# Patient Record
Sex: Female | Born: 1960 | Race: Black or African American | Hispanic: No | Marital: Single | Smoking: Never smoker
Health system: Southern US, Community
[De-identification: ages and names within clinical notes are randomized; demographics above are authoritative.]

## PROBLEM LIST (undated history)

## (undated) DIAGNOSIS — I10 Essential (primary) hypertension: Secondary | ICD-10-CM

## (undated) DIAGNOSIS — T464X5A Adverse effect of angiotensin-converting-enzyme inhibitors, initial encounter: Secondary | ICD-10-CM

## (undated) DIAGNOSIS — T7840XA Allergy, unspecified, initial encounter: Secondary | ICD-10-CM

## (undated) DIAGNOSIS — R05 Cough: Secondary | ICD-10-CM

## (undated) DIAGNOSIS — J45909 Unspecified asthma, uncomplicated: Secondary | ICD-10-CM

## (undated) DIAGNOSIS — R058 Other specified cough: Secondary | ICD-10-CM

## (undated) HISTORY — DX: Adverse effect of angiotensin-converting-enzyme inhibitors, initial encounter: T46.4X5A

## (undated) HISTORY — DX: Cough: R05

## (undated) HISTORY — DX: Allergy, unspecified, initial encounter: T78.40XA

## (undated) HISTORY — DX: Essential (primary) hypertension: I10

## (undated) HISTORY — DX: Unspecified asthma, uncomplicated: J45.909

## (undated) HISTORY — DX: Other specified cough: R05.8

---
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
## 1990-12-01 HISTORY — PX: TUBAL LIGATION: SHX77

## 2004-01-09 ENCOUNTER — Other Ambulatory Visit: Admission: RE | Admit: 2004-01-09 | Discharge: 2004-01-09 | Payer: Self-pay | Admitting: Obstetrics and Gynecology

## 2007-09-24 ENCOUNTER — Ambulatory Visit (HOSPITAL_COMMUNITY): Admission: RE | Admit: 2007-09-24 | Discharge: 2007-09-24 | Payer: Self-pay | Admitting: Obstetrics and Gynecology

## 2009-05-22 ENCOUNTER — Ambulatory Visit (HOSPITAL_COMMUNITY): Admission: RE | Admit: 2009-05-22 | Discharge: 2009-05-22 | Payer: Self-pay | Admitting: Obstetrics and Gynecology

## 2010-07-22 ENCOUNTER — Ambulatory Visit (HOSPITAL_COMMUNITY): Admission: RE | Admit: 2010-07-22 | Discharge: 2010-07-22 | Payer: Self-pay | Admitting: Obstetrics and Gynecology

## 2011-10-17 ENCOUNTER — Other Ambulatory Visit (HOSPITAL_COMMUNITY): Payer: Self-pay | Admitting: Obstetrics and Gynecology

## 2011-10-17 DIAGNOSIS — Z1231 Encounter for screening mammogram for malignant neoplasm of breast: Secondary | ICD-10-CM

## 2011-11-19 ENCOUNTER — Ambulatory Visit (HOSPITAL_COMMUNITY)
Admission: RE | Admit: 2011-11-19 | Discharge: 2011-11-19 | Disposition: A | Discharge status: Home / Self Care | Payer: BC Managed Care – PPO | Source: Ambulatory Visit | Attending: Obstetrics and Gynecology | Admitting: Obstetrics and Gynecology

## 2011-11-19 DIAGNOSIS — Z1231 Encounter for screening mammogram for malignant neoplasm of breast: Secondary | ICD-10-CM | POA: Insufficient documentation

## 2011-11-28 ENCOUNTER — Ambulatory Visit (INDEPENDENT_AMBULATORY_CARE_PROVIDER_SITE_OTHER): Payer: BC Managed Care – PPO

## 2011-11-28 DIAGNOSIS — J45909 Unspecified asthma, uncomplicated: Secondary | ICD-10-CM

## 2012-11-25 ENCOUNTER — Ambulatory Visit (INDEPENDENT_AMBULATORY_CARE_PROVIDER_SITE_OTHER): Payer: BC Managed Care – PPO | Admitting: Family Medicine

## 2012-11-25 VITALS — BP 138/92 | HR 70 | Temp 98.1°F | Resp 16 | Ht 63.5 in | Wt 201.6 lb

## 2012-11-25 DIAGNOSIS — Z0000 Encounter for general adult medical examination without abnormal findings: Secondary | ICD-10-CM

## 2012-11-25 DIAGNOSIS — Z1211 Encounter for screening for malignant neoplasm of colon: Secondary | ICD-10-CM

## 2012-11-25 DIAGNOSIS — Z23 Encounter for immunization: Secondary | ICD-10-CM

## 2012-11-25 DIAGNOSIS — R195 Other fecal abnormalities: Secondary | ICD-10-CM

## 2012-11-25 LAB — POCT CBC
Granulocyte percent: 34.4 % — AB (ref 37–80)
HCT, POC: 47 % (ref 37.7–47.9)
Hemoglobin: 14.8 g/dL (ref 12.2–16.2)
Lymph, poc: 3.2 (ref 0.6–3.4)
MCH, POC: 29.4 pg (ref 27–31.2)
MCHC: 31.5 g/dL — AB (ref 31.8–35.4)
MCV: 93.5 fL (ref 80–97)
MID (cbc): 0.6 (ref 0–0.9)
MPV: 8.9 fL (ref 0–99.8)
POC Granulocyte: 2 (ref 2–6.9)
POC LYMPH PERCENT: 55 % — AB (ref 10–50)
POC MID %: 10.6 % (ref 0–12)
Platelet Count, POC: 414 10*3/uL (ref 142–424)
RBC: 5.03 M/uL (ref 4.04–5.48)
RDW, POC: 14.6 %
WBC: 5.8 10*3/uL (ref 4.6–10.2)

## 2012-11-25 LAB — COMPREHENSIVE METABOLIC PANEL
ALT: 17 U/L (ref 0–35)
Albumin: 4.2 g/dL (ref 3.5–5.2)
Alkaline Phosphatase: 108 U/L (ref 39–117)
BUN: 10 mg/dL (ref 6–23)
Creat: 0.68 mg/dL (ref 0.50–1.10)
Total Bilirubin: 0.3 mg/dL (ref 0.3–1.2)
Total Protein: 6.7 g/dL (ref 6.0–8.3)

## 2012-11-25 LAB — LDL CHOLESTEROL, DIRECT: Direct LDL: 114 mg/dL — ABNORMAL HIGH

## 2012-11-25 LAB — COMPREHENSIVE METABOLIC PANEL WITH GFR
AST: 25 U/L (ref 0–37)
CO2: 29 meq/L (ref 19–32)
Calcium: 9.7 mg/dL (ref 8.4–10.5)
Chloride: 101 meq/L (ref 96–112)
Glucose, Bld: 93 mg/dL (ref 70–99)
Potassium: 4.4 meq/L (ref 3.5–5.3)
Sodium: 140 meq/L (ref 135–145)

## 2012-11-25 LAB — IFOBT (OCCULT BLOOD): IFOBT: POSITIVE

## 2012-11-25 NOTE — Progress Notes (Signed)
  Tuberculosis Risk Questionnaire  1. Were you born outside the [place] in one of the following parts of the world:    [place], [place], [place], [place] or [place]?  No  2. Have you traveled outside the [place] and lived for more than one month in one of the following parts of the world:  [place], [place], [place], [place] or [place]?  No  3. Do you have a compromised immune system such as from any of the following conditions:  HIV/AIDS, organ or bone marrow transplantation, diabetes, immunosuppressive   medicines (e.g. Prednisone, Remicaide), leukemia, lymphoma, cancer of the   head or neck, gastrectomy or jejunal bypass, end-stage renal disease (on   dialysis), or silicosis?  No    4. Have you ever done one of the following:    Used crack cocaine, injected illegal drugs, worked or resided in jail or prison,   worked or resided at a homeless shelter, or worked as a Research scientist (physical sciences) in   direct contact with patients?  No  5. Have you ever been exposed to anyone with infectious tuberculosis?  No   Tuberculosis Symptom Questionnaire  Do you currently have any of the following symptoms?  1. Unexplained cough lasting more than 3 weeks? No  Unexplained fever lasting more than 3 weeks. No   3. Night Sweats (sweating that leaves the bedclothes and sheets wet)   No  4. Shortness of Breath No  5. Chest Pain No  6. Unintentional weight loss  No  7. Unexplained fatigue (very tired for no reason) No   Tuberculosis Risk Questionnaire  1. Were you born outside the [place] in one of the following parts of the world:    [place], [place], [place], [place] or [place]?  No  2. Have you traveled outside the [place] and lived for more than one month in one of the following parts of the world:  [place], [place], [place], [place] or [place]?  No  3. Do you have a compromised immune system such as from any of the following  conditions:  HIV/AIDS, organ or bone marrow transplantation, diabetes, immunosuppressive   medicines (e.g. Prednisone, Remicaide), leukemia, lymphoma, cancer of the   head or neck, gastrectomy or jejunal bypass, end-stage renal disease (on   dialysis), or silicosis?  No    4. Have you ever done one of the following:    Used crack cocaine, injected illegal drugs, worked or resided in jail or prison,   worked or resided at a homeless shelter, or worked as a Research scientist (physical sciences) in   direct contact with patients?  No  5. Have you ever been exposed to anyone with infectious tuberculosis?  No   Tuberculosis Symptom Questionnaire  Do you currently have any of the following symptoms?  1. Unexplained cough lasting more than 3 weeks? No  Unexplained fever lasting more than 3 weeks. No   3. Night Sweats (sweating that leaves the bedclothes and sheets wet)   No  4. Shortness of Breath No  5. Chest Pain No  6. Unintentional weight loss  No  7. Unexplained fatigue (very tired for no reason) No

## 2012-11-25 NOTE — Progress Notes (Signed)
[facility]:  Office Visit  Chief Complaint:  Chief Complaint  Patient presents with  . Annual Exam    HPI: Veronica Elliott is a 51 y.o. female who complains of  Annual without pap. Last pap 2012 [facility]. No h/o abnormal paps. Regular breast exam, no abnormal mammograms. 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 UTD on all vaccine. Last tetanus per patient was 2-3 years ago. No complaints.   Past Medical History  Diagnosis Date  . Allergy   . Asthma    Past Surgical History  Procedure Date  . Appendectomy   . Tubal ligation    History   Social History  . Marital Status: Single    Spouse Name: N/A    Number of Children: N/A  . Years of Education: N/A   Social History Main Topics  . Smoking status: Never Smoker   . Smokeless tobacco: None  . Alcohol Use: No  . Drug Use: No  . Sexually Active: No   Other Topics Concern  . None   Social History Narrative  . None   Family History  Problem Relation Age of Onset  . Stroke Mother   . Prostate cancer Father   . Diabetes Brother   . Diabetes Maternal Grandmother   . Prostate cancer Maternal Grandfather    No Known Allergies Prior to Admission medications   Medication Sig Start Date End Date Taking? Authorizing Provider  albuterol (PROAIR HFA) 108 (90 BASE) MCG/ACT inhaler Inhale 2 puffs into the lungs every 6 (six) hours as needed.   Yes Historical Provider, MD  beclomethasone (QVAR) 80 MCG/ACT inhaler Inhale 1 puff into the lungs daily.   Yes Historical Provider, MD  Cholecalciferol (VITAMIN D) 2000 UNITS CAPS Take 1 capsule by mouth daily.   Yes Historical Provider, MD  Multiple Vitamins-Minerals (ONE-A-DAY 50 PLUS PO) Take 1 tablet by mouth daily.   Yes Historical Provider, MD     ROS: The patient denies fevers, chills, night sweats, unintentional weight loss, chest pain, palpitations, wheezing, dyspnea on exertion, nausea, vomiting, abdominal pain, dysuria, hematuria, melena, numbness,  weakness, or tingling.   All other systems have been reviewed and were otherwise negative with the exception of those mentioned in the HPI and as above.    PHYSICAL EXAM: Filed Vitals:   11/25/12 1230  BP: 138/92  Pulse: 70  Temp: 98.1 F (36.7 C)  Resp: 16   Filed Vitals:   11/25/12 1230  Height: 5' 3.5" (1.613 m)  Weight: 201 lb 9.6 oz (91.445 kg)   Body mass index is 35.15 kg/(m^2).  General: Alert, no acute distress HEENT:  Normocephalic, atraumatic, oropharynx patent. EOMI, PERRLA Cardiovascular:  Regular rate and rhythm, no rubs murmurs or gallops.  No Carotid bruits, radial pulse intact. No pedal edema.  Respiratory: Clear to auscultation bilaterally.  No wheezes, rales, or rhonchi.  No cyanosis, no use of accessory musculature GI: No organomegaly, abdomen is soft and non-tender, positive bowel sounds.  No masses. Skin: No rashes. Neurologic: Facial musculature symmetric. Psychiatric: Patient is appropriate throughout our interaction. Lymphatic: No cervical lymphadenopathy Musculoskeletal: Gait intact. Breast Exam nl Decline pelvic exam since on pap Rectal exam-nl   LABS: Results for orders placed in visit on 11/25/12  POCT CBC      Component Value Range   WBC 5.8  4.6 - 10.2 K/uL   Lymph, poc 3.2  0.6 - 3.4   POC LYMPH PERCENT 55.0 (*) 10 - 50 %L  MID (cbc) 0.6  0 - 0.9   POC MID % 10.6  0 - 12 %M   POC Granulocyte 2.0  2 - 6.9   Granulocyte percent 34.4 (*) 37 - 80 %G   RBC 5.03  4.04 - 5.48 M/uL   Hemoglobin 14.8  12.2 - 16.2 g/dL   HCT, POC 16.1  09.6 - 47.9 %   MCV 93.5  80 - 97 fL   MCH, POC 29.4  27 - 31.2 pg   MCHC 31.5 (*) 31.8 - 35.4 g/dL   RDW, POC 04.5     Platelet Count, POC 414  142 - 424 K/uL   MPV 8.9  0 - 99.8 fL  IFOBT (OCCULT BLOOD)      Component Value Range   IFOBT Positive       EKG/XRAY:   Primary read interpreted by Dr. Conley Rolls at [facility].   ASSESSMENT/PLAN: Encounter Diagnosis  Name Primary?  . Annual physical exam Yes    Refer to GI for screening colonoscopy. IFOBT positive possibly due to being on menses Advise to go get mammogram as scheduled Advise to return for pelvic exam when she is not menstruating since she declined it for this office visit Patient received flu vaccine TB screening low risk, no tuberculin placement needed Labs: CMP, LDL     Veronica Sciascia PHUONG, DO 11/25/2012 1:29 PM

## 2013-02-07 ENCOUNTER — Other Ambulatory Visit: Payer: Self-pay | Admitting: Internal Medicine

## 2013-02-21 ENCOUNTER — Other Ambulatory Visit (HOSPITAL_COMMUNITY): Payer: Self-pay | Admitting: Obstetrics and Gynecology

## 2013-02-21 DIAGNOSIS — Z0000 Encounter for general adult medical examination without abnormal findings: Secondary | ICD-10-CM

## 2013-02-22 ENCOUNTER — Encounter: Payer: Self-pay | Admitting: Family Medicine

## 2013-02-24 ENCOUNTER — Ambulatory Visit (HOSPITAL_COMMUNITY)
Admission: RE | Admit: 2013-02-24 | Discharge: 2013-02-24 | Disposition: A | Discharge status: Home / Self Care | Payer: BC Managed Care – PPO | Source: Ambulatory Visit | Attending: Obstetrics and Gynecology | Admitting: Obstetrics and Gynecology

## 2013-02-24 DIAGNOSIS — Z1231 Encounter for screening mammogram for malignant neoplasm of breast: Secondary | ICD-10-CM | POA: Insufficient documentation

## 2013-02-24 DIAGNOSIS — Z0000 Encounter for general adult medical examination without abnormal findings: Secondary | ICD-10-CM

## 2013-03-17 ENCOUNTER — Encounter: Payer: Self-pay | Admitting: Family Medicine

## 2013-05-24 ENCOUNTER — Other Ambulatory Visit: Payer: Self-pay | Admitting: Internal Medicine

## 2014-01-26 ENCOUNTER — Other Ambulatory Visit: Payer: Self-pay | Admitting: Internal Medicine

## 2014-01-26 ENCOUNTER — Ambulatory Visit
Admission: RE | Admit: 2014-01-26 | Discharge: 2014-01-26 | Disposition: A | Discharge status: Home / Self Care | Payer: Managed Care, Other (non HMO) | Source: Ambulatory Visit | Attending: Internal Medicine | Admitting: Internal Medicine

## 2014-01-26 DIAGNOSIS — R05 Cough: Secondary | ICD-10-CM

## 2014-01-26 DIAGNOSIS — R059 Cough, unspecified: Secondary | ICD-10-CM

## 2014-05-04 ENCOUNTER — Other Ambulatory Visit (HOSPITAL_COMMUNITY): Payer: Self-pay | Admitting: Obstetrics and Gynecology

## 2014-05-04 DIAGNOSIS — Z1231 Encounter for screening mammogram for malignant neoplasm of breast: Secondary | ICD-10-CM

## 2014-05-15 ENCOUNTER — Ambulatory Visit (HOSPITAL_COMMUNITY)
Admission: RE | Admit: 2014-05-15 | Discharge: 2014-05-15 | Disposition: A | Discharge status: Home / Self Care | Payer: Managed Care, Other (non HMO) | Source: Ambulatory Visit | Attending: Obstetrics and Gynecology | Admitting: Obstetrics and Gynecology

## 2014-05-15 DIAGNOSIS — Z1231 Encounter for screening mammogram for malignant neoplasm of breast: Secondary | ICD-10-CM | POA: Insufficient documentation

## 2016-04-19 ENCOUNTER — Ambulatory Visit (INDEPENDENT_AMBULATORY_CARE_PROVIDER_SITE_OTHER): Payer: Managed Care, Other (non HMO) | Admitting: Family Medicine

## 2016-04-19 VITALS — BP 110/70 | HR 90 | Temp 99.6°F | Resp 20 | Ht 63.0 in | Wt 190.1 lb

## 2016-04-19 DIAGNOSIS — R062 Wheezing: Secondary | ICD-10-CM

## 2016-04-19 DIAGNOSIS — J209 Acute bronchitis, unspecified: Secondary | ICD-10-CM | POA: Diagnosis not present

## 2016-04-19 DIAGNOSIS — R05 Cough: Secondary | ICD-10-CM | POA: Diagnosis not present

## 2016-04-19 MED ORDER — BECLOMETHASONE DIPROPIONATE 80 MCG/ACT IN AERS: 1.0000 | INHALATION_SPRAY | Freq: Two times a day (BID) | RESPIRATORY_TRACT | Status: DC

## 2016-04-19 MED ORDER — PREDNISONE 50 MG PO TABS: 50.0000 mg | ORAL_TABLET | Freq: Every day | ORAL | Status: DC

## 2016-04-19 MED ORDER — BENZONATATE 200 MG PO CAPS: 200.0000 mg | ORAL_CAPSULE | Freq: Three times a day (TID) | ORAL | Status: DC | PRN

## 2016-04-19 MED ORDER — ALBUTEROL SULFATE (2.5 MG/3ML) 0.083% IN NEBU
2.5000 mg | INHALATION_SOLUTION | Freq: Once | RESPIRATORY_TRACT | Status: AC
  Administered 2016-04-19: 2.5 mg via RESPIRATORY_TRACT

## 2016-04-19 MED ORDER — IPRATROPIUM BROMIDE 0.02 % IN SOLN
0.5000 mg | Freq: Once | RESPIRATORY_TRACT | Status: AC
  Administered 2016-04-19: 0.5 mg via RESPIRATORY_TRACT

## 2016-04-19 MED ORDER — GUAIFENESIN-CODEINE 100-10 MG/5ML PO SOLN: 5.0000 mL | Freq: Every evening | ORAL | Status: DC | PRN

## 2016-04-19 MED ORDER — ALBUTEROL SULFATE HFA 108 (90 BASE) MCG/ACT IN AERS: 2.0000 | INHALATION_SPRAY | RESPIRATORY_TRACT | Status: AC | PRN

## 2016-04-19 NOTE — Patient Instructions (Addendum)
Thank you for coming in today. Return as needed.  Use albuterol inhaler every 4 hours as needed.  Continue the QVAR inhaler.  Take prednisone for 5 days.  Use cough medicine as needed.  Call or go to the emergency room if you get worse, have trouble breathing, have chest pains, or palpitations.  Follow up with your doctor.   Acute Bronchitis Bronchitis is inflammation of the airways that extend from the windpipe into the lungs (bronchi). The inflammation often causes mucus to develop. This leads to a cough, which is the most common symptom of bronchitis.  In acute bronchitis, the condition usually develops suddenly and goes away over time, usually in a couple weeks. Smoking, allergies, and asthma can make bronchitis worse. Repeated episodes of bronchitis may cause further lung problems.  CAUSES Acute bronchitis is most often caused by the same virus that causes a cold. The virus can spread from person to person (contagious) through coughing, sneezing, and touching contaminated objects. SIGNS AND SYMPTOMS   Cough.   Fever.   Coughing up mucus.   Body aches.   Chest congestion.   Chills.   Shortness of breath.   Sore throat.  DIAGNOSIS  Acute bronchitis is usually diagnosed through a physical exam. Your health care provider will also ask you questions about your medical history. Tests, such as chest X-rays, are sometimes done to rule out other conditions.  TREATMENT  Acute bronchitis usually goes away in a couple weeks. Oftentimes, no medical treatment is necessary. Medicines are sometimes given for relief of fever or cough. Antibiotic medicines are usually not needed but may be prescribed in certain situations. In some cases, an inhaler may be recommended to help reduce shortness of breath and control the cough. A cool mist vaporizer may also be used to help thin bronchial secretions and make it easier to clear the chest.  HOME CARE INSTRUCTIONS  Get plenty of rest.    Drink enough fluids to keep your urine clear or pale yellow (unless you have a medical condition that requires fluid restriction). Increasing fluids may help thin your respiratory secretions (sputum) and reduce chest congestion, and it will prevent dehydration.   Take medicines only as directed by your health care provider.  If you were prescribed an antibiotic medicine, finish it all even if you start to feel better.  Avoid smoking and secondhand smoke. Exposure to cigarette smoke or irritating chemicals will make bronchitis worse. If you are a smoker, consider using nicotine gum or skin patches to help control withdrawal symptoms. Quitting smoking will help your lungs heal faster.   Reduce the chances of another bout of acute bronchitis by washing your hands frequently, avoiding people with cold symptoms, and trying not to touch your hands to your mouth, nose, or eyes.   Keep all follow-up visits as directed by your health care provider.  SEEK MEDICAL CARE IF: Your symptoms do not improve after 1 week of treatment.  SEEK IMMEDIATE MEDICAL CARE IF:  You develop an increased fever or chills.   You have chest pain.   You have severe shortness of breath.  You have bloody sputum.   You develop dehydration.  You faint or repeatedly feel like you are going to pass out.  You develop repeated vomiting.  You develop a severe headache. MAKE SURE YOU:   Understand these instructions.  Will watch your condition.  Will get help right away if you are not doing well or get worse.   This information  is not intended to replace advice given to you by your health care provider. Make sure you discuss any questions you have with your health care provider.   Document Released: 12/25/2004 Document Revised: 12/08/2014 Document Reviewed: 05/10/2013 Elsevier Interactive Patient Education 2016 [employer].      IF you received an x-ray today, you will receive an invoice from  [facility]. Please contact [facility] at [phone] with questions or concerns regarding your invoice.   IF you received labwork today, you will receive an invoice from [employer]. Please contact [facility] at [phone] with questions or concerns regarding your invoice.   Our billing staff will not be able to assist you with questions regarding bills from these companies.  You will be contacted with the lab results as soon as they are available. The fastest way to get your results is to activate your My Chart account. Instructions are located on the last page of this paperwork. If you have not heard from [place] regarding the results in 2 weeks, please contact this office.

## 2016-04-19 NOTE — Progress Notes (Signed)
Laurian BrimVondelia E Leitz is a 55 y.o. female who presents to [facility] today for cough congestion shortness of breath and wheezing. Symptoms present for a few days and consistent previous episodes of asthma or bronchitis. No fevers chills vomiting or diarrhea. She has run out of her albuterol inhaler. She feels well otherwise.   Past Medical History  Diagnosis Date  . Allergy   . Asthma   . Hypertension    Past Surgical History  Procedure Laterality Date  . Appendectomy    . Tubal ligation     Social History  Substance Use Topics  . Smoking status: Never Smoker   . Smokeless tobacco: Never Used  . Alcohol Use: No   ROS as above Medications: Current Outpatient Prescriptions  Medication Sig Dispense Refill  . albuterol (PROAIR HFA) 108 (90 Base) MCG/ACT inhaler Inhale 2 puffs into the lungs every 4 (four) hours as needed for wheezing or shortness of breath. 18 g 1  . beclomethasone (QVAR) 80 MCG/ACT inhaler Inhale 1 puff into the lungs 2 (two) times daily. 1 Inhaler 1  . Cholecalciferol (VITAMIN D) 2000 UNITS CAPS Take 1 capsule by mouth daily.    Marland Kitchen. losartan (COZAAR) 100 MG tablet Take 100 mg by mouth daily.    . Multiple Vitamins-Minerals (ONE-A-DAY 50 PLUS PO) Take 1 tablet by mouth daily.    . benzonatate (TESSALON) 200 MG capsule Take 1 capsule (200 mg total) by mouth 3 (three) times daily as needed for cough. 45 capsule 0  . guaiFENesin-codeine 100-10 MG/5ML syrup Take 5 mLs by mouth at bedtime as needed for cough. 120 mL 0  . predniSONE (DELTASONE) 50 MG tablet Take 1 tablet (50 mg total) by mouth daily. 5 tablet 0   No current facility-administered medications for this visit.   No Known Allergies   Exam:  BP 110/70 mmHg  Pulse 90  Temp(Src) 99.6 F (37.6 C) (Oral)  Resp 20  Ht 5\' 3"  (1.6 m)  Wt 190 lb 2 oz (86.24 kg)  BMI 33.69 kg/m2  SpO2 91% Gen: Well NAD HEENT: EOMI,  MMM Lungs: Mild increased work of breathing. Prolonged expiratory phase and wheezing present  bilaterally. Heart: RRR no MRG Abd: NABS, Soft. Nondistended, Nontender Exts: Brisk capillary refill, warm and well perfused.   Patient was given a 2.5/0.5 mg DuoNeb nebulizer treatment, and felt better  No results found for this or any previous visit (from the past 24 hour(s)). No results found.  Assessment and Plan: 55 y.o. female with bronchitis/asthma. Treat with prednisone albuterol Qvar and codeine cough syrup as well as Tessalon Perles. Return if not better.  Discussed warning signs or symptoms. Please see discharge instructions. Patient expresses understanding.

## 2016-06-04 ENCOUNTER — Ambulatory Visit
Admission: RE | Admit: 2016-06-04 | Discharge: 2016-06-04 | Disposition: A | Discharge status: Home / Self Care | Payer: Managed Care, Other (non HMO) | Source: Ambulatory Visit | Attending: Internal Medicine | Admitting: Internal Medicine

## 2016-06-04 ENCOUNTER — Other Ambulatory Visit: Payer: Self-pay | Admitting: Internal Medicine

## 2016-06-04 DIAGNOSIS — R05 Cough: Secondary | ICD-10-CM

## 2016-06-04 DIAGNOSIS — R059 Cough, unspecified: Secondary | ICD-10-CM

## 2016-07-05 ENCOUNTER — Other Ambulatory Visit: Payer: Self-pay | Admitting: Family Medicine

## 2016-11-14 ENCOUNTER — Ambulatory Visit (INDEPENDENT_AMBULATORY_CARE_PROVIDER_SITE_OTHER): Payer: Managed Care, Other (non HMO) | Admitting: Physician Assistant

## 2016-11-14 VITALS — BP 130/80 | HR 80 | Temp 98.5°F | Resp 17 | Ht 63.5 in | Wt 191.0 lb

## 2016-11-14 DIAGNOSIS — R432 Parageusia: Secondary | ICD-10-CM | POA: Diagnosis not present

## 2016-11-14 DIAGNOSIS — R062 Wheezing: Secondary | ICD-10-CM | POA: Diagnosis not present

## 2016-11-14 MED ORDER — PREDNISONE 20 MG PO TABS: ORAL_TABLET | ORAL | 0 refills | Status: DC

## 2016-11-14 MED ORDER — IPRATROPIUM BROMIDE 0.02 % IN SOLN
0.5000 mg | Freq: Once | RESPIRATORY_TRACT | Status: AC
  Administered 2016-11-14: 0.5 mg via RESPIRATORY_TRACT

## 2016-11-14 MED ORDER — ALBUTEROL SULFATE (2.5 MG/3ML) 0.083% IN NEBU
2.5000 mg | INHALATION_SOLUTION | Freq: Once | RESPIRATORY_TRACT | Status: AC
  Administered 2016-11-14: 2.5 mg via RESPIRATORY_TRACT

## 2016-11-14 MED ORDER — OMEPRAZOLE 20 MG PO CPDR: 20.0000 mg | DELAYED_RELEASE_CAPSULE | Freq: Every day | ORAL | 1 refills | Status: DC

## 2016-11-14 MED ORDER — CETIRIZINE HCL 10 MG PO TABS: 10.0000 mg | ORAL_TABLET | Freq: Every day | ORAL | 1 refills | Status: DC

## 2016-11-14 MED ORDER — RANITIDINE HCL 150 MG PO TABS: 150.0000 mg | ORAL_TABLET | Freq: Two times a day (BID) | ORAL | 1 refills | Status: DC

## 2016-11-14 NOTE — Progress Notes (Signed)
[facility] [address], [place] [address] [phone]  Date:  11/14/2016   Name:  Veronica Elliott   DOB:  1961/11/26   MRN:  [id number]  PCP:  Alva GarnetSHELTON,KIMBERLY R., MD    History of Present Illness:  Veronica Elliott is a 55 y.o. female patient who presents to [facility] for cough   Cough for about 2-3 months.  The symptoms would go away but then return.  For the past couple of nights, she has very little sleep.  Spit coming that is clear.  Sour taste.  No sob or dyspnea.  No fever.  No nasal congestion, or ear pain.  No sore throat.  No hx of reflex.  No nausea.  She has a heaviness and drained. She takes cough syrup.   She is using  She been going to her primary care in 05/2016 and 08/2016.   She is currently taking qvar.  proair as needed which is generally twice per day if she is at work.  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  She uses it when she gets overheated, stressed, and cold.     There are no active problems to display for this patient.   Past Medical History:  Diagnosis Date  . Allergy   . Asthma   . Hypertension     Past Surgical History:  Procedure Laterality Date  . APPENDECTOMY    . TUBAL LIGATION      Social History  Substance Use Topics  . Smoking status: Never Smoker  . Smokeless tobacco: Never Used  . Alcohol use No    Family History  Problem Relation Age of Onset  . Stroke Mother   . Prostate cancer Father   . Diabetes Brother   . Diabetes Maternal Grandmother   . Prostate cancer Maternal Grandfather     No Known Allergies  Medication list has been reviewed and updated.  Current Outpatient Prescriptions on File Prior to Visit  Medication Sig Dispense Refill  . albuterol (PROAIR HFA) 108 (90 Base) MCG/ACT inhaler Inhale 2 puffs into the lungs every 4 (four) hours as needed for wheezing or shortness of breath. 18 g 1  . beclomethasone (QVAR) 80 MCG/ACT inhaler Inhale 1 puff into the lungs 2 (two) times daily. 1 Inhaler 1  .  benzonatate (TESSALON) 200 MG capsule Take 1 capsule (200 mg total) by mouth 3 (three) times daily as needed for cough. 45 capsule 0  . Cholecalciferol (VITAMIN D) 2000 UNITS CAPS Take 1 capsule by mouth daily.    Marland Kitchen. guaiFENesin-codeine 100-10 MG/5ML syrup Take 5 mLs by mouth at bedtime as needed for cough. 120 mL 0  . losartan (COZAAR) 100 MG tablet Take 100 mg by mouth daily.    . Multiple Vitamins-Minerals (ONE-A-DAY 50 PLUS PO) Take 1 tablet by mouth daily.    . predniSONE (DELTASONE) 50 MG tablet Take 1 tablet (50 mg total) by mouth daily. 5 tablet 0   No current facility-administered medications on file prior to visit.     ROS ROS otherwise unremarkable unless listed above.   Physical Examination: BP 130/80 (BP Location: Right Arm, Patient Position: Sitting, Cuff Size: Normal)   Pulse 80   Temp 98.5 F (36.9 C) (Oral)   Resp 17   Ht 5' 3.5" (1.613 m)   Wt 191 lb (86.6 kg)   SpO2 96%   BMI 33.30 kg/m  Ideal Body Weight: Weight in (lb) to have BMI = 25: 143.1  Physical  Exam  Constitutional: She is oriented to person, place, and time. She appears well-developed and well-nourished. No distress.  HENT:  Head: Normocephalic and atraumatic.  Right Ear: Tympanic membrane, external ear and ear canal normal.  Left Ear: Tympanic membrane, external ear and ear canal normal.  Nose: Mucosal edema and rhinorrhea present. Right sinus exhibits no maxillary sinus tenderness and no frontal sinus tenderness. Left sinus exhibits no maxillary sinus tenderness and no frontal sinus tenderness.  Mouth/Throat: No uvula swelling. No oropharyngeal exudate, posterior oropharyngeal edema or posterior oropharyngeal erythema.  Eyes: Conjunctivae and EOM are normal. Pupils are equal, round, and reactive to light.  Cardiovascular: Normal rate and regular rhythm.  Exam reveals no gallop, no distant heart sounds and no friction rub.   No murmur heard. Pulmonary/Chest: Effort normal. No respiratory distress.  She has no decreased breath sounds. She has no wheezes. She has no rhonchi.  Lymphadenopathy:       Head (right side): No submandibular, no tonsillar, no preauricular and no posterior auricular adenopathy present.       Head (left side): No submandibular, no tonsillar, no preauricular and no posterior auricular adenopathy present.  Neurological: She is alert and oriented to person, place, and time.  Skin: She is not diaphoretic.  Psychiatric: She has a normal mood and affect. Her behavior is normal.     Assessment and Plan: Veronica Elliott is a 55 y.o. female who is here today for cc of cough. Given cough to preterm inflammation and ease coughing. I have advised that she start a prednisone taper while Zyrtec is in place.Marland Kitchen. She will also take a PPI and H2 blocker.there is possible she was exacerbated wheezing. Possibly reflux causing the wheezing. She will start both.  She will return after discontinuing one, if this is culprit, or return if no improvement at all.  Wheezing - Plan: albuterol (PROVENTIL) (2.5 MG/3ML) 0.083% nebulizer solution 2.5 mg, ipratropium (ATROVENT) nebulizer solution 0.5 mg, cetirizine (ZYRTEC) 10 MG tablet, predniSONE (DELTASONE) 20 MG tablet, ranitidine (ZANTAC) 150 MG tablet, omeprazole (PRILOSEC) 20 MG capsule  Abnormal sense of taste - Plan: ranitidine (ZANTAC) 150 MG tablet   Trena PlattStephanie Jolan Mealor, PA-C [facility] 11/14/2016 2:50 PM

## 2016-11-14 NOTE — Patient Instructions (Addendum)
Please take the medications as prescribed.  You can start the prednisone tomorrow. I would like you to stay on the medications for at least 2 weeks.  If you feel symptoms have resolved, take either the zyrtec OR the zantac/omeprazole away.  If you notice symptoms come back--restart them and follow up with your physician.  If your symptoms do not improve, you need to return.     IF you received an x-ray today, you will receive an invoice from [facility]. Please contact [facility] at [phone] with questions or concerns regarding your invoice.   IF you received labwork today, you will receive an invoice from [facility]. Please contact [facility] at [phone] with questions or concerns regarding your invoice.   Our billing staff will not be able to assist you with questions regarding bills from these companies.  You will be contacted with the lab results as soon as they are available. The fastest way to get your results is to activate your My Chart account. Instructions are located on the last page of this paperwork. If you have not heard from us regarding the results in 2 weeks, please contact this office.     We recommend that you schedule a mammogram for breast cancer screening. Typically, you do not need a referral to do this. Please contact a local imaging center to schedule your mammogram.  [facility] - [phone](336) [phone]  *ask for the Radiology Department [facility]) - [phone](336) [phone] or [phone]) [phone]  [facility] - [phone] [facility] - [phone] [facility] - [phone](336) [phone]  *ask for the Radiology Department [facility] - [phone](336) [phone]  *ask for the Radiology Department [facility] - [phone]  *ask for the Mammography Department [facility] - [phone](336) [phone]

## 2017-02-05 ENCOUNTER — Ambulatory Visit
Admission: RE | Admit: 2017-02-05 | Discharge: 2017-02-05 | Disposition: A | Discharge status: Home / Self Care | Payer: Managed Care, Other (non HMO) | Source: Ambulatory Visit | Attending: Internal Medicine | Admitting: Internal Medicine

## 2017-02-05 ENCOUNTER — Other Ambulatory Visit: Payer: Self-pay | Admitting: Internal Medicine

## 2017-02-05 DIAGNOSIS — J45909 Unspecified asthma, uncomplicated: Secondary | ICD-10-CM

## 2017-02-05 DIAGNOSIS — R05 Cough: Secondary | ICD-10-CM

## 2017-02-05 DIAGNOSIS — R059 Cough, unspecified: Secondary | ICD-10-CM

## 2017-02-21 ENCOUNTER — Ambulatory Visit (HOSPITAL_COMMUNITY)
Admission: EM | Admit: 2017-02-21 | Discharge: 2017-02-21 | Disposition: A | Discharge status: Home / Self Care | Payer: Managed Care, Other (non HMO) | Attending: Internal Medicine | Admitting: Internal Medicine

## 2017-02-21 ENCOUNTER — Encounter (HOSPITAL_COMMUNITY): Payer: Self-pay | Admitting: *Deleted

## 2017-02-21 DIAGNOSIS — J301 Allergic rhinitis due to pollen: Secondary | ICD-10-CM | POA: Diagnosis not present

## 2017-02-21 DIAGNOSIS — J4541 Moderate persistent asthma with (acute) exacerbation: Secondary | ICD-10-CM | POA: Diagnosis not present

## 2017-02-21 MED ORDER — PREDNISONE 50 MG PO TABS: ORAL_TABLET | ORAL | 0 refills | Status: DC

## 2017-02-21 MED ORDER — IPRATROPIUM-ALBUTEROL 0.5-2.5 (3) MG/3ML IN SOLN
RESPIRATORY_TRACT | Status: AC
  Filled 2017-02-21: qty 3

## 2017-02-21 MED ORDER — IPRATROPIUM-ALBUTEROL 0.5-2.5 (3) MG/3ML IN SOLN
3.0000 mL | Freq: Once | RESPIRATORY_TRACT | Status: AC
  Administered 2017-02-21: 3 mL via RESPIRATORY_TRACT

## 2017-02-21 NOTE — Discharge Instructions (Signed)
Your symptoms are consistent with allergies and exacerbation of your asthma. Use your albuterol 2 puffs every 4 hours as needed for cough and wheeze. Be sure to use her Qvar now as directed. Start taking the prednisone as directed and take with food. Drink plenty fluids and stay well-hydrated. He will probably need to be on an antihistamine for a while during allergy season. Below are a list of medications to help with your symptoms.

## 2017-02-21 NOTE — ED Triage Notes (Signed)
Pt  Reports   She  Has  Had  A  Cough  For  quiite  A   While  Recently   Worse   With  Sinus  Headache   And  Congestion  Pt  Reports      History  Of  Asthma  In past  As  Well

## 2017-02-21 NOTE — ED Provider Notes (Signed)
CSN: [id number]     Arrival date & time 02/21/17  1347 History   First MD Initiated Contact with Patient 02/21/17 1457     Chief Complaint  Patient presents with  . URI   (Consider location/radiation/quality/duration/timing/severity/associated sxs/prior Treatment) Pleasant 56 year old female who presents with a dry hacking cough that has been worsening over the past month. She can use and has chills, headache or the mid forehead and paranasal areas, congestion of the nose, runny nose, sneezing and occasionally itchy eyes. Her chief complaint though is the hacking cough. She does have a history of asthma. Denies fever or chills.      Past Medical History:  Diagnosis Date  . Allergy   . Asthma   . Hypertension    Past Surgical History:  Procedure Laterality Date  . APPENDECTOMY    . TUBAL LIGATION     Family History  Problem Relation Age of Onset  . Stroke Mother   . Prostate cancer Father   . Diabetes Brother   . Diabetes Maternal Grandmother   . Prostate cancer Maternal Grandfather    Social History  Substance Use Topics  . Smoking status: Never Smoker  . Smokeless tobacco: Never Used  . Alcohol use No   OB History    No data available     Review of Systems  Constitutional: Positive for activity change. Negative for appetite change, chills, fatigue and fever.  HENT: Positive for congestion, postnasal drip, rhinorrhea, sinus pressure and voice change. Negative for facial swelling.   Eyes: Positive for itching.  Respiratory: Positive for cough. Negative for shortness of breath.   Cardiovascular: Negative.   Musculoskeletal: Negative for neck pain and neck stiffness.  Skin: Negative for pallor and rash.  Neurological: Negative.   All other systems reviewed and are negative.   Allergies  Patient has no known allergies.  Home Medications   Prior to Admission medications   Medication Sig Start Date End Date Taking? Authorizing Provider  albuterol (PROAIR HFA)  108 (90 Base) MCG/ACT inhaler Inhale 2 puffs into the lungs every 4 (four) hours as needed for wheezing or shortness of breath. 04/19/16   Rodolph Bong, MD  beclomethasone (QVAR) 80 MCG/ACT inhaler Inhale 1 puff into the lungs 2 (two) times daily. 04/19/16   Rodolph Bong, MD  benzonatate (TESSALON) 200 MG capsule Take 1 capsule (200 mg total) by mouth 3 (three) times daily as needed for cough. 04/19/16   Rodolph Bong, MD  cetirizine (ZYRTEC) 10 MG tablet Take 1 tablet (10 mg total) by mouth daily. 11/14/16   Garnetta Buddy, PA  Cholecalciferol (VITAMIN D) 2000 UNITS CAPS Take 1 capsule by mouth daily.    Historical Provider, MD  guaiFENesin-codeine 100-10 MG/5ML syrup Take 5 mLs by mouth at bedtime as needed for cough. 04/19/16   Rodolph Bong, MD  losartan (COZAAR) 100 MG tablet Take 100 mg by mouth daily.    Historical Provider, MD  Multiple Vitamins-Minerals (ONE-A-DAY 50 PLUS PO) Take 1 tablet by mouth daily.    Historical Provider, MD  omeprazole (PRILOSEC) 20 MG capsule Take 1 capsule (20 mg total) by mouth daily. 11/14/16   Collie Siad English, PA  predniSONE (DELTASONE) 50 MG tablet 1 tab po daily for 6 days. Take with food. 02/21/17   Hayden Rasmussen, NP  ranitidine (ZANTAC) 150 MG tablet Take 1 tablet (150 mg total) by mouth 2 (two) times daily. 11/14/16   Garnetta Buddy, PA   Meds Ordered and  Administered this Visit   Medications  ipratropium-albuterol (DUONEB) 0.5-2.5 (3) MG/3ML nebulizer solution 3 mL (not administered)    BP (!) 162/91 (BP Location: Left Arm)   Pulse 82   Temp 98.6 F (37 C) (Oral)   Resp 18   LMP 11/25/2012   SpO2 98%  No data found.   Physical Exam  Constitutional: She is oriented to person, place, and time. She appears well-developed and well-nourished. No distress.  HENT:  Head: Normocephalic and atraumatic.  Bilateral TMs are mildly retracted, oropharynx with light streaky erythema. Unable to get a good look at the entire oropharynx due to tongue  retraction. No exudates. Swelling. Airway widely patent.  Eyes: EOM are normal.  Neck: Normal range of motion. Neck supple.  Cardiovascular: Normal rate, regular rhythm and normal heart sounds.   Pulmonary/Chest: Effort normal. She has no rales.  Bilateral expiratory wheezing and mildly prolonged expiratory phase.  Musculoskeletal: Normal range of motion.  Lymphadenopathy:    She has no cervical adenopathy.  Neurological: She is alert and oriented to person, place, and time.  Skin: Skin is warm and dry.  Psychiatric: She has a normal mood and affect.  Nursing note and vitals reviewed.   Urgent Care Course     Procedures (including critical care time)  Labs Review Labs Reviewed - No data to display  Imaging Review No results found.   Visual Acuity Review  Right Eye Distance:   Left Eye Distance:   Bilateral Distance:    Right Eye Near:   Left Eye Near:    Bilateral Near:         MDM   1. Acute seasonal allergic rhinitis due to pollen   2. Moderate persistent asthma with exacerbation    Sudafed PE 10 mg every 4 to 6 hours as needed for congestion Allegra or Zyrtec daily as needed for drainage and runny nose. For stronger antihistamine may take Chlor-Trimeton 2 to 4 mg every 4 to 6 hours, may cause drowsiness. Saline nasal spray used frequently. Ibuprofen 600 mg every 6 hours as needed for pain, discomfort or fever. Drink plenty of fluids and stay well-hydrated. Flonase or Rhinocort nasal spray daily Meds ordered this encounter  Medications  . predniSONE (DELTASONE) 50 MG tablet    Sig: 1 tab po daily for 6 days. Take with food.    Dispense:  6 tablet    Refill:  0    Order Specific Question:   Supervising Provider    Answer:   Eustace MooreMURRAY, LAURA W [[id number]]  . ipratropium-albuterol (DUONEB) 0.5-2.5 (3) MG/3ML nebulizer solution 3 mL       Hayden Rasmussenavid Idaly Verret, NP 02/21/17 1530

## 2017-02-23 ENCOUNTER — Other Ambulatory Visit: Payer: Self-pay | Admitting: Family Medicine

## 2017-02-23 ENCOUNTER — Other Ambulatory Visit: Payer: Self-pay | Admitting: Physician Assistant

## 2017-02-23 DIAGNOSIS — R062 Wheezing: Secondary | ICD-10-CM

## 2017-02-23 NOTE — Telephone Encounter (Signed)
Refill not authorized. Should come from PCP: Dr. Andi DevonKimberly Shelton.

## 2017-03-12 ENCOUNTER — Ambulatory Visit (INDEPENDENT_AMBULATORY_CARE_PROVIDER_SITE_OTHER): Payer: Managed Care, Other (non HMO) | Admitting: Pulmonary Disease

## 2017-03-12 ENCOUNTER — Encounter: Payer: Self-pay | Admitting: Pulmonary Disease

## 2017-03-12 VITALS — BP 108/64 | HR 63 | Ht 62.0 in | Wt 182.0 lb

## 2017-03-12 DIAGNOSIS — R058 Other specified cough: Secondary | ICD-10-CM

## 2017-03-12 DIAGNOSIS — J342 Deviated nasal septum: Secondary | ICD-10-CM | POA: Diagnosis not present

## 2017-03-12 DIAGNOSIS — R05 Cough: Secondary | ICD-10-CM | POA: Diagnosis not present

## 2017-03-12 DIAGNOSIS — R053 Chronic cough: Secondary | ICD-10-CM

## 2017-03-12 DIAGNOSIS — J4530 Mild persistent asthma, uncomplicated: Secondary | ICD-10-CM | POA: Diagnosis not present

## 2017-03-12 LAB — NITRIC OXIDE: NITRIC OXIDE: 83

## 2017-03-12 MED ORDER — MONTELUKAST SODIUM 10 MG PO TABS: 10.0000 mg | ORAL_TABLET | Freq: Every day | ORAL | 5 refills | Status: DC

## 2017-03-12 MED ORDER — FLUTICASONE PROPIONATE 50 MCG/ACT NA SUSP: 2.0000 | Freq: Every day | NASAL | 2 refills | Status: DC

## 2017-03-12 NOTE — Progress Notes (Signed)
   Subjective:    Patient ID: Veronica Elliott, female    DOB: 14-May-1961, 56 y.o.   MRN: [id number]  HPI    Review of Systems  Constitutional: Negative for fever and unexpected weight change.  HENT: Positive for sore throat. Negative for congestion, dental problem, ear pain, nosebleeds, postnasal drip, rhinorrhea, sinus pressure, sneezing and trouble swallowing.   Eyes: Negative for redness and itching.  Respiratory: Positive for cough. Negative for chest tightness, shortness of breath and wheezing.   Cardiovascular: Negative for palpitations and leg swelling.  Gastrointestinal: Negative for nausea and vomiting.  Genitourinary: Negative for dysuria.  Musculoskeletal: Negative for joint swelling.  Skin: Negative for rash.  Neurological: Negative for headaches.  Hematological: Does not bruise/bleed easily.  Psychiatric/Behavioral: Negative for dysphoric mood. The patient is not nervous/anxious.        Objective:   Physical Exam        Assessment & Plan:

## 2017-03-12 NOTE — Patient Instructions (Addendum)
Nasal irrigation (salt water spray) each nostril daily Flonase one spray each nostril daily Salt water gargle once or twice per day Sip water when you have urge to cough Use sugarless candy to keep your mouth moist Montelukast (singulair) 10 mg pill nightly Will arrange for pulmonary function test Will get copy of recent lab tests from Dr. Mathews Robinsons office Continue Qvar 1 puff twice per day Continue albuterol two puffs every 4 to 6 hours as needed for cough, wheeze, or chest congestion  Follow up in 3 to 4 weeks with Dr. Craige Cotta or Nurse Practitioner

## 2017-03-12 NOTE — Progress Notes (Signed)
Past surgical history She  has a past surgical history that includes Appendectomy (1968) and Tubal ligation (1992).  Family history Her family history includes Diabetes in her brother and maternal grandmother; Prostate cancer in her father and maternal grandfather; Stroke in her mother.  Social history She  reports that she has never smoked. She has never used smokeless tobacco. She reports that she does not drink alcohol or use drugs.  Allergies  Allergen Reactions  . Lisinopril Cough     Review of Systems  Constitutional: Negative for fever and unexpected weight change.  HENT: Positive for sore throat. Negative for congestion, dental problem, ear pain, nosebleeds, postnasal drip, rhinorrhea, sinus pressure, sneezing and trouble swallowing.   Eyes: Negative for redness and itching.  Respiratory: Positive for cough. Negative for chest tightness, shortness of breath and wheezing.   Cardiovascular: Negative for palpitations and leg swelling.  Gastrointestinal: Negative for nausea and vomiting.  Genitourinary: Negative for dysuria.  Musculoskeletal: Negative for joint swelling.  Skin: Negative for rash.  Neurological: Negative for headaches.  Hematological: Does not bruise/bleed easily.  Psychiatric/Behavioral: Negative for dysphoric mood. The patient is not nervous/anxious.     Current Outpatient Prescriptions on File Prior to Visit  Medication Sig  . albuterol (PROAIR HFA) 108 (90 Base) MCG/ACT inhaler Inhale 2 puffs into the lungs every 4 (four) hours as needed for wheezing or shortness of breath.  . benzonatate (TESSALON) 200 MG capsule Take 1 capsule (200 mg total) by mouth 3 (three) times daily as needed for cough.  . cetirizine (ZYRTEC) 10 MG tablet Take 1 tablet (10 mg total) by mouth daily.  . Cholecalciferol (VITAMIN D) 2000 UNITS CAPS Take 1 capsule by mouth daily.  . Multiple Vitamins-Minerals (ONE-A-DAY 50 PLUS PO) Take 1 tablet by mouth daily.  Marland Kitchen QVAR 80 MCG/ACT inhaler  inhale 1 PUFF into THE lungs 2 TIMES DAILY  . ranitidine (ZANTAC) 150 MG tablet Take 1 tablet (150 mg total) by mouth 2 (two) times daily.   No current facility-administered medications on file prior to visit.     Chief Complaint  Patient presents with  . Pulmonary Consult    Self Referral for cough >> started several months ago. Reports having CXRs in March. Pt states that she has been given Prednisone and Tessalon Perles which did not give any relief.     Pulmonary tests FeNO 03/12/17 >> 83  Past medical history She  has a past medical history of ACE-inhibitor cough; Allergy; Asthma; and Hypertension.  Vital signs BP 108/64 (BP Location: Left Arm, Cuff Size: Normal)   Pulse 63   Ht  (1.575 m)   Wt 182 lb (82.6 kg)   LMP 11/25/2012   SpO2 97%   BMI 33.29 kg/m   History of present illness Veronica Elliott is a 56 y.o. female with cough.  She has noticed cough for several months.  She was told she has allergies and asthma.  She was tried on prednisone and tessalon, but didn't feel like these helped much.  She has been using Qvar and albuterol, and these help some.  She was started on lisinopril for blood pressure, and developed a cough within a week of starting this.  She had lisinopril d/c'ed, and her cough improved but didn't go away completely.  She had allergy testing years ago.  She doesn't remember results, but is not aware of any significant allergies.  She does get sinus congestion and post nasal drip.  Her cough seems to happen  more at night, but happens during the day also.  She feels like something is stuck in her throat, and will occasionally cough up clear sputum.  She does notice trouble breathing through her nose, especially on the left side.  She gets intermittent episodes of wheezing, but this is typically in her upper chest.  She was born in [place], but grew up in [place].  She had a pet dog until a few months ago.  She works in [employer], and  notices her cough would be worse at work.  She never smoked cigarettes, but has a roommate who smokes but does so outside.  No history of pneumonia or exposure to tuberculosis.  She denies skin rash, gland swelling, joint swelling.  She was tried on GERD therapy, but no improvement with cough.  Physical exam  General - No distress ENT - No sinus tenderness, no oral exudate, no LAN, no thyromegaly, TM clear, pupils equal/reactive, nasal septal deviation, mild cerumen build up  Cardiac - s1s2 regular, no murmur, pulses symmetric Chest - No wheeze/rales/dullness, good air entry, normal respiratory excursion Back - No focal tenderness Abd - Soft, non-tender, no organomegaly, + bowel sounds Ext - No edema Neuro - Normal strength, cranial nerves intact Skin - No rashes Psych - Normal mood, and behavior   CMP Latest Ref Rng & Units 11/25/2012  Glucose 70 - 99 mg/dL 93  BUN 6 - 23 mg/dL 10  Creatinine 4.09 - 8.11 mg/dL 9.14  Sodium 782 - 956 mEq/L 140  Potassium 3.5 - 5.3 mEq/L 4.4  Chloride 96 - 112 mEq/L 101  CO2 19 - 32 mEq/L 29  Calcium 8.4 - 10.5 mg/dL 9.7  Total Protein 6.0 - 8.3 g/dL 6.7  Total Bilirubin 0.3 - 1.2 mg/dL 0.3  Alkaline Phos 39 - 117 U/L 108  AST 0 - 37 U/L 25  ALT 0 - 35 U/L 17     CBC Latest Ref Rng & Units 11/25/2012  WBC 4.6 - 10.2 K/uL 5.8  Hemoglobin 12.2 - 16.2 g/dL 21.3  Hematocrit 08.6 - 47.9 % 47.0     CXR 02/05/17 - no ASD  Discussion She has chronic cough which is likely multifactorial: upper airway cough from post nasal drip with nasal septal deviation, and asthma (with possible occupational component).   Assessment/plan  Upper airway cough syndrome with post nasal drip and nasal septal deviation. - nasal irrigation - flonase - continue zyrtec - add montelukast - sip water with urge to cough, salt water gargles  Asthma. - continue Qvar and prn albuterol - add montelukast  - will arrange for PFTs - will get copy of recent labs from  PCP >> depending on what was done, will need to check CBC with diff, RAST with IgE  ACE inhibitor cough. - continue ARB for now >> if cough persists, then might need to change this also   Patient Instructions  Nasal irrigation (salt water spray) each nostril daily Flonase one spray each nostril daily Salt water gargle once or twice per day Sip water when you have urge to cough Use sugarless candy to keep your mouth moist Montelukast (singulair) 10 mg pill nightly Will arrange for pulmonary function test Will get copy of recent lab tests from Dr. Mathews Robinsons office Continue Qvar 1 puff twice per day Continue albuterol two puffs every 4 to 6 hours as needed for cough, wheeze, or chest congestion  Follow up in 3 to 4 weeks with Dr. Craige Cotta or Nurse Practitioner  Veronica Helling, MD [facility] Pager:  [phone] 03/12/2017, 11:15 AM

## 2017-12-13 IMAGING — CR DG CHEST 2V
2 series · 2 of 2 positions shown · non-contrast
Comparison: PA and lateral chest x-ray January 26, 2014

CLINICAL DATA: Two months of cough, abnormal chest exam, suspect
bronchitis or pneumonia, nonsmoker, history of asthma.

EXAM:
CHEST  2 VIEW

[w chest pa]
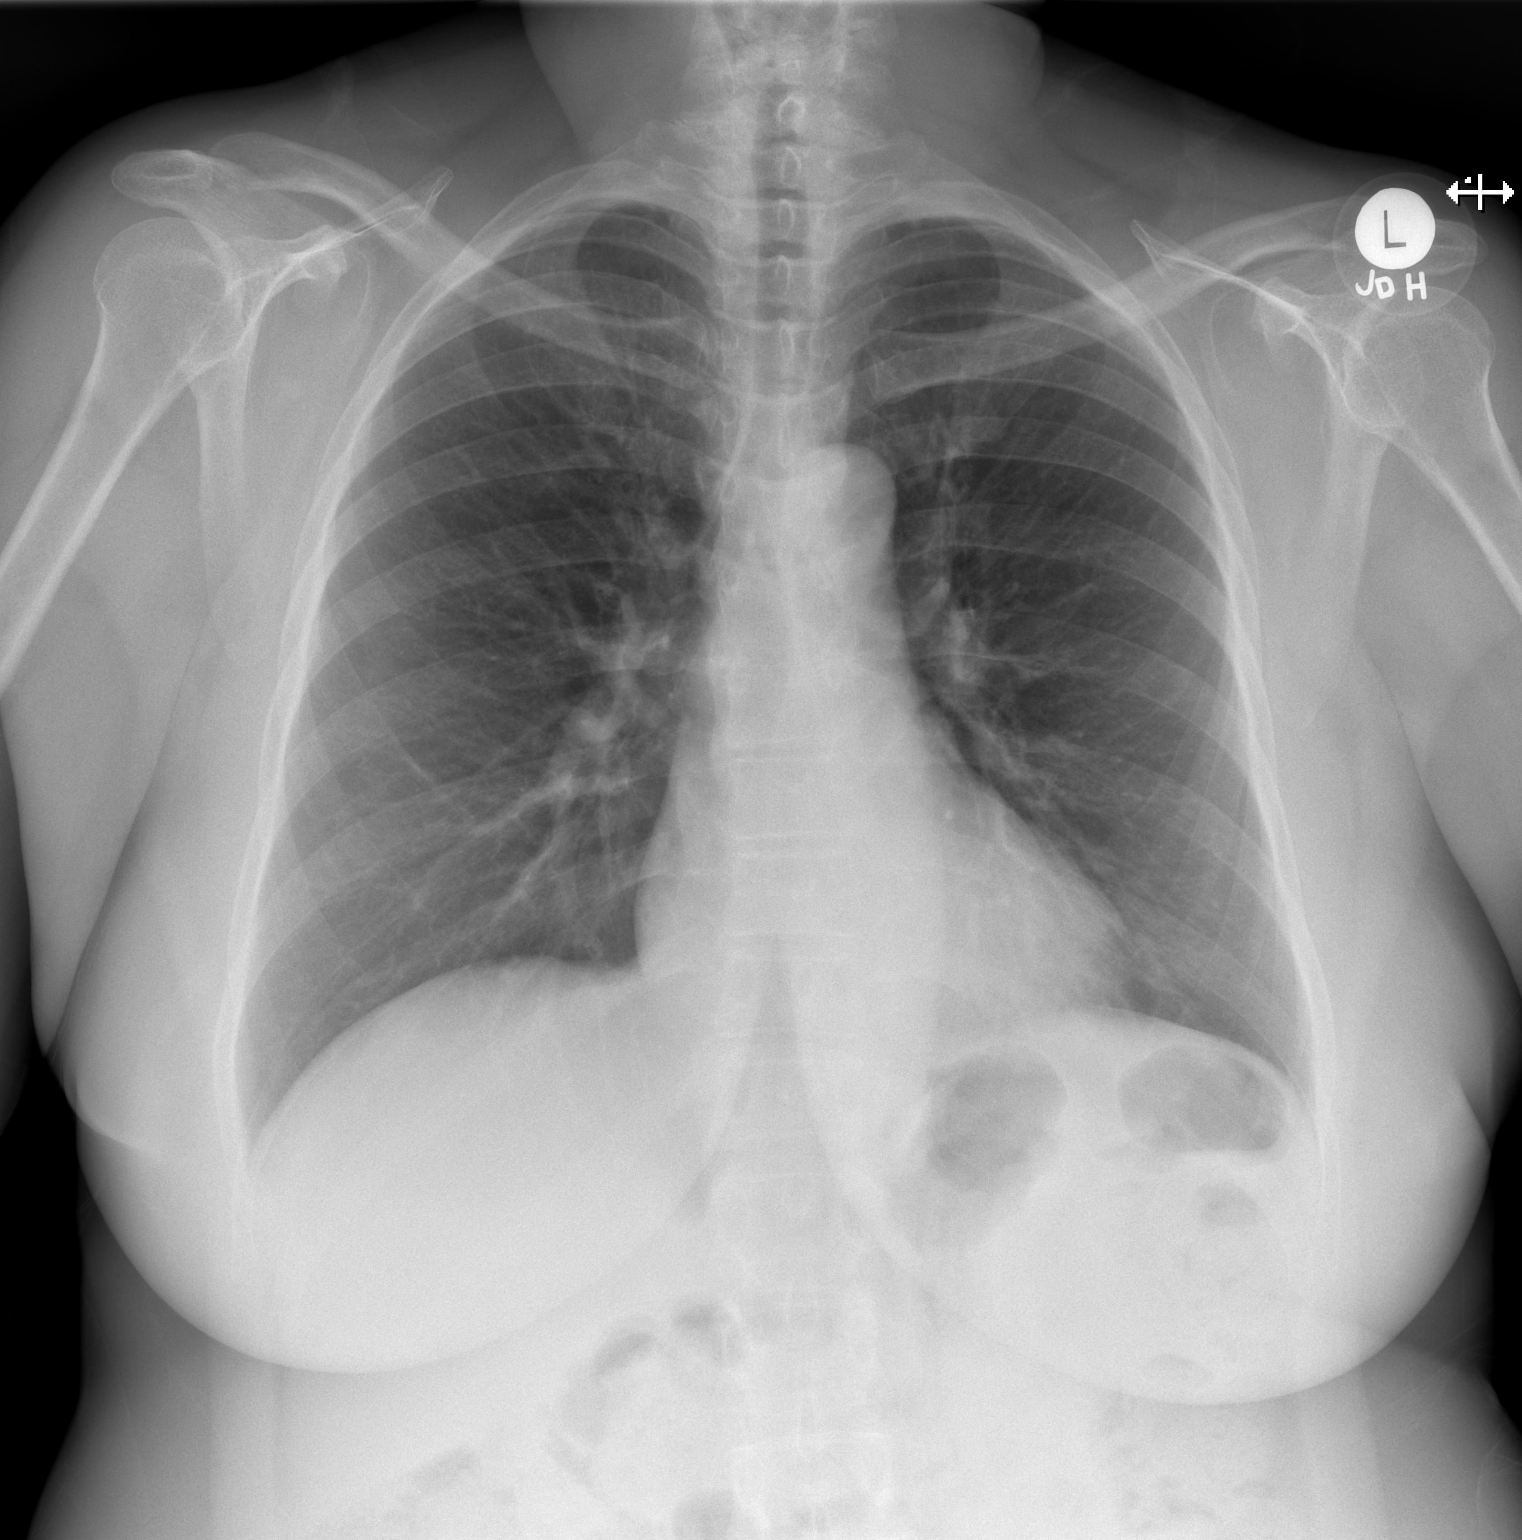

[w chest lat]
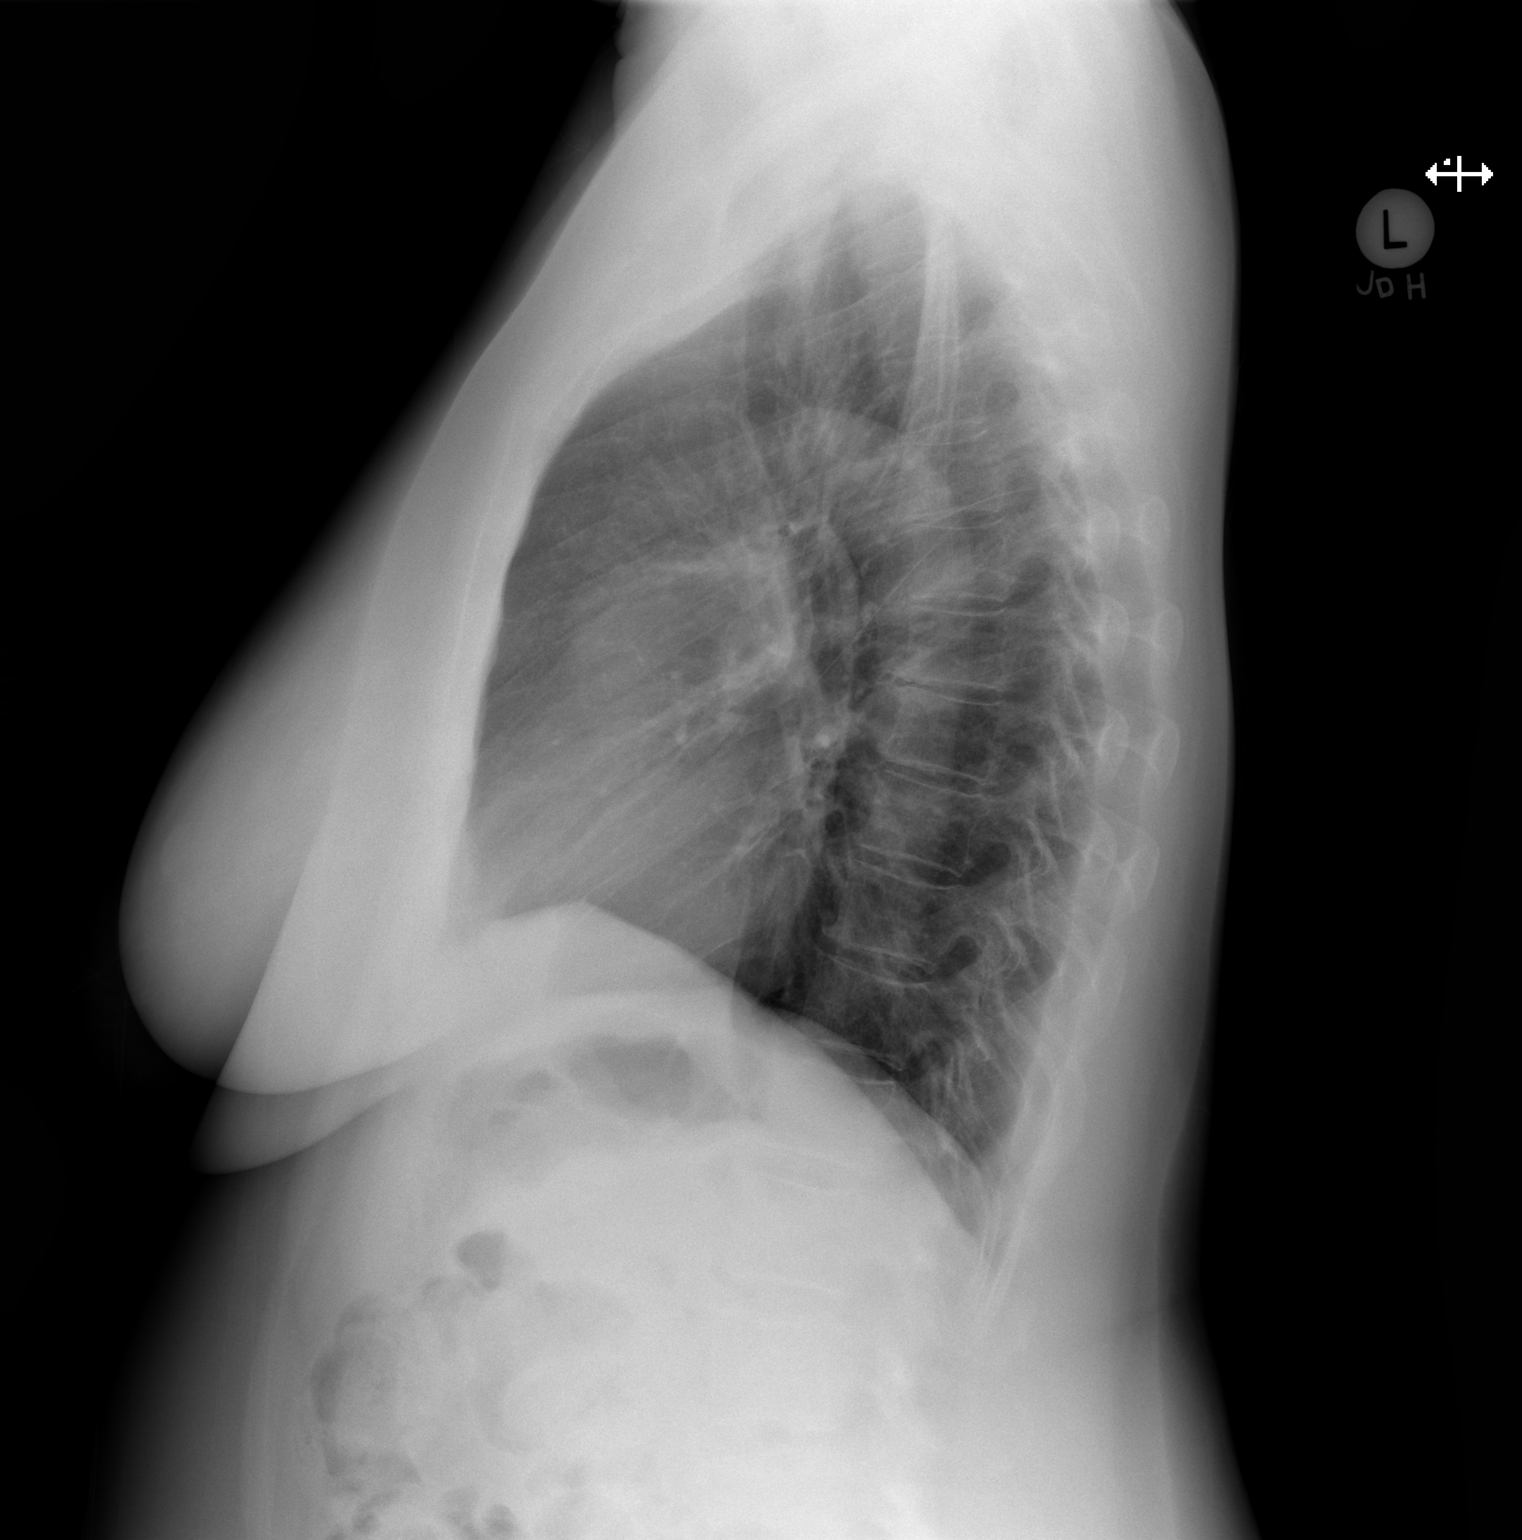

[2 of 2 positions shown; findings below may reference images not displayed]

FINDINGS: The lungs are adequately inflated and clear. The heart and pulmonary
vascularity are normal. The mediastinum is normal in width. There is
no pleural effusion or pneumothorax. The bony thorax is
unremarkable.
IMPRESSION: There is no active cardiopulmonary disease.

## 2018-08-16 IMAGING — CR DG CHEST 2V
2 series · 2 of 2 positions shown · non-contrast
Comparison: Radiographs June 04, 2016.

CLINICAL DATA: Cough for 3 months.

EXAM:
CHEST  2 VIEW

[w chest pa]
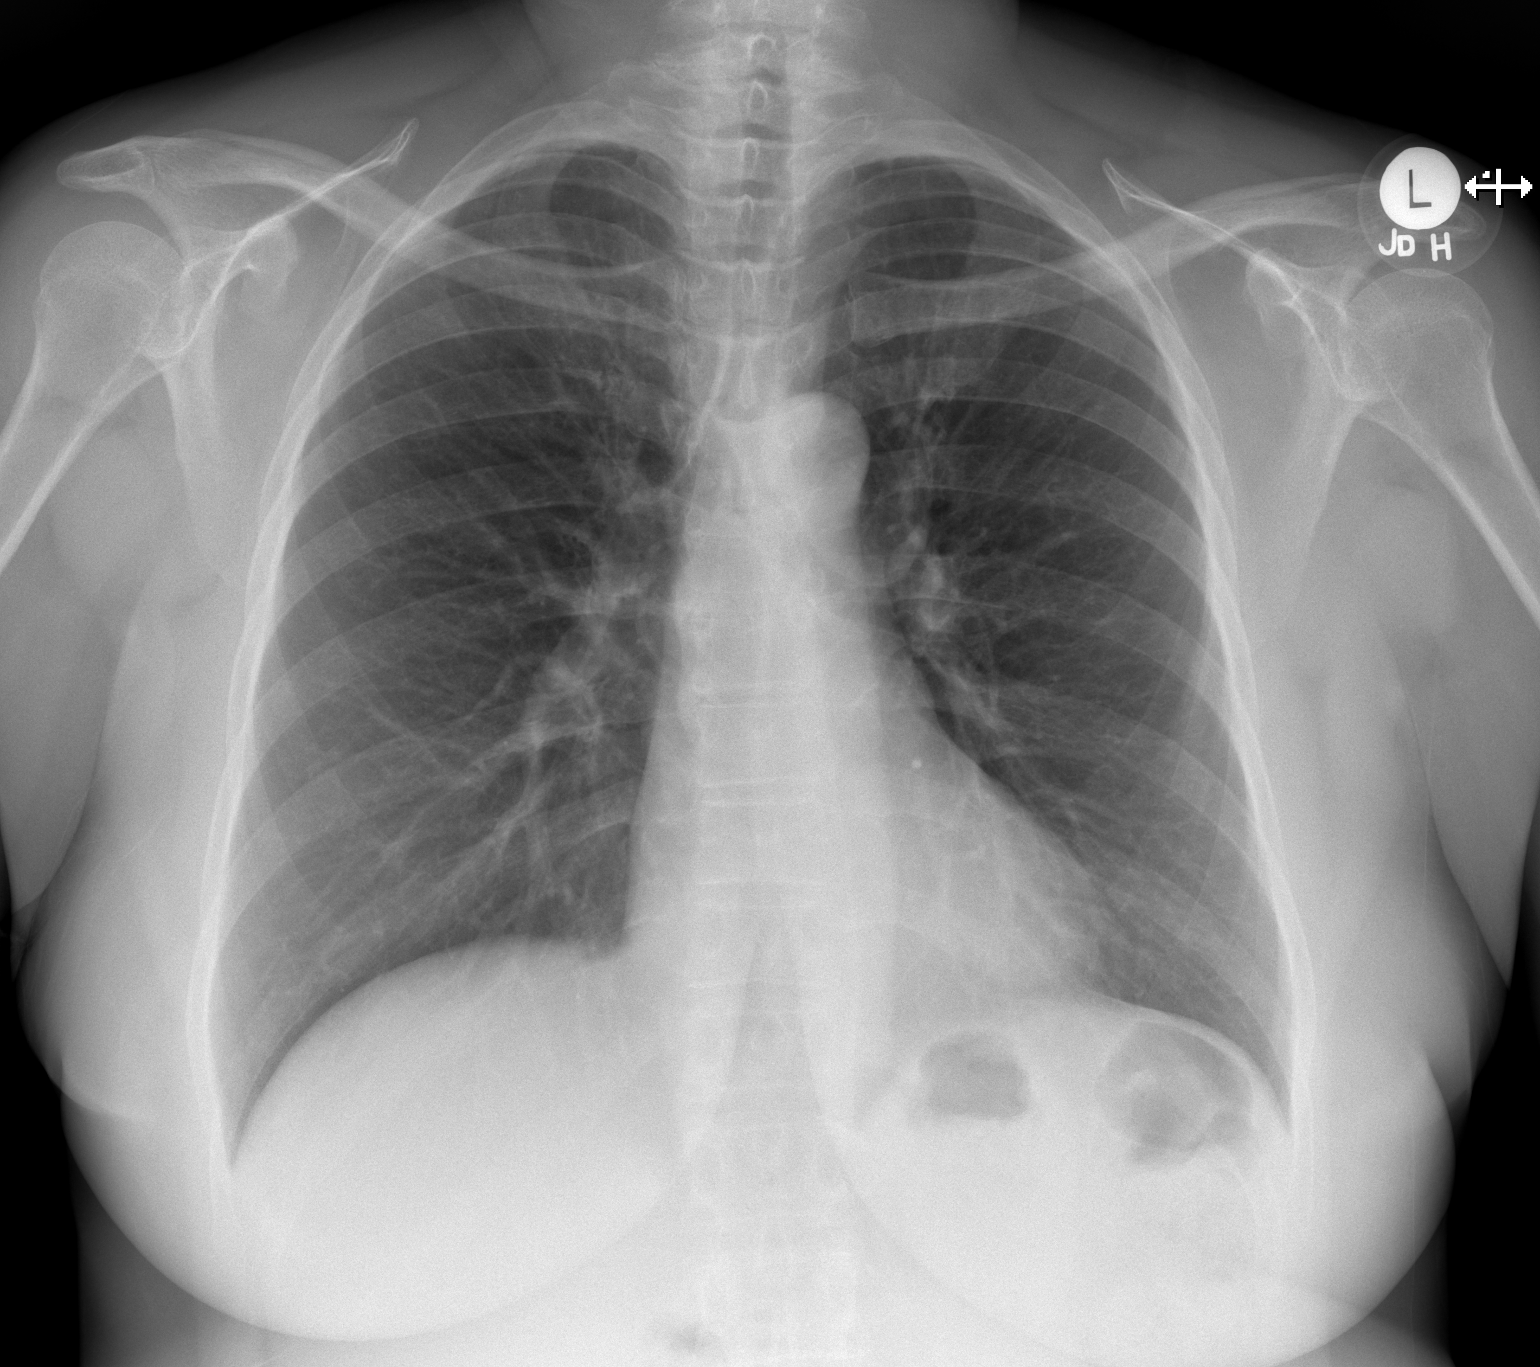

[w chest lat]
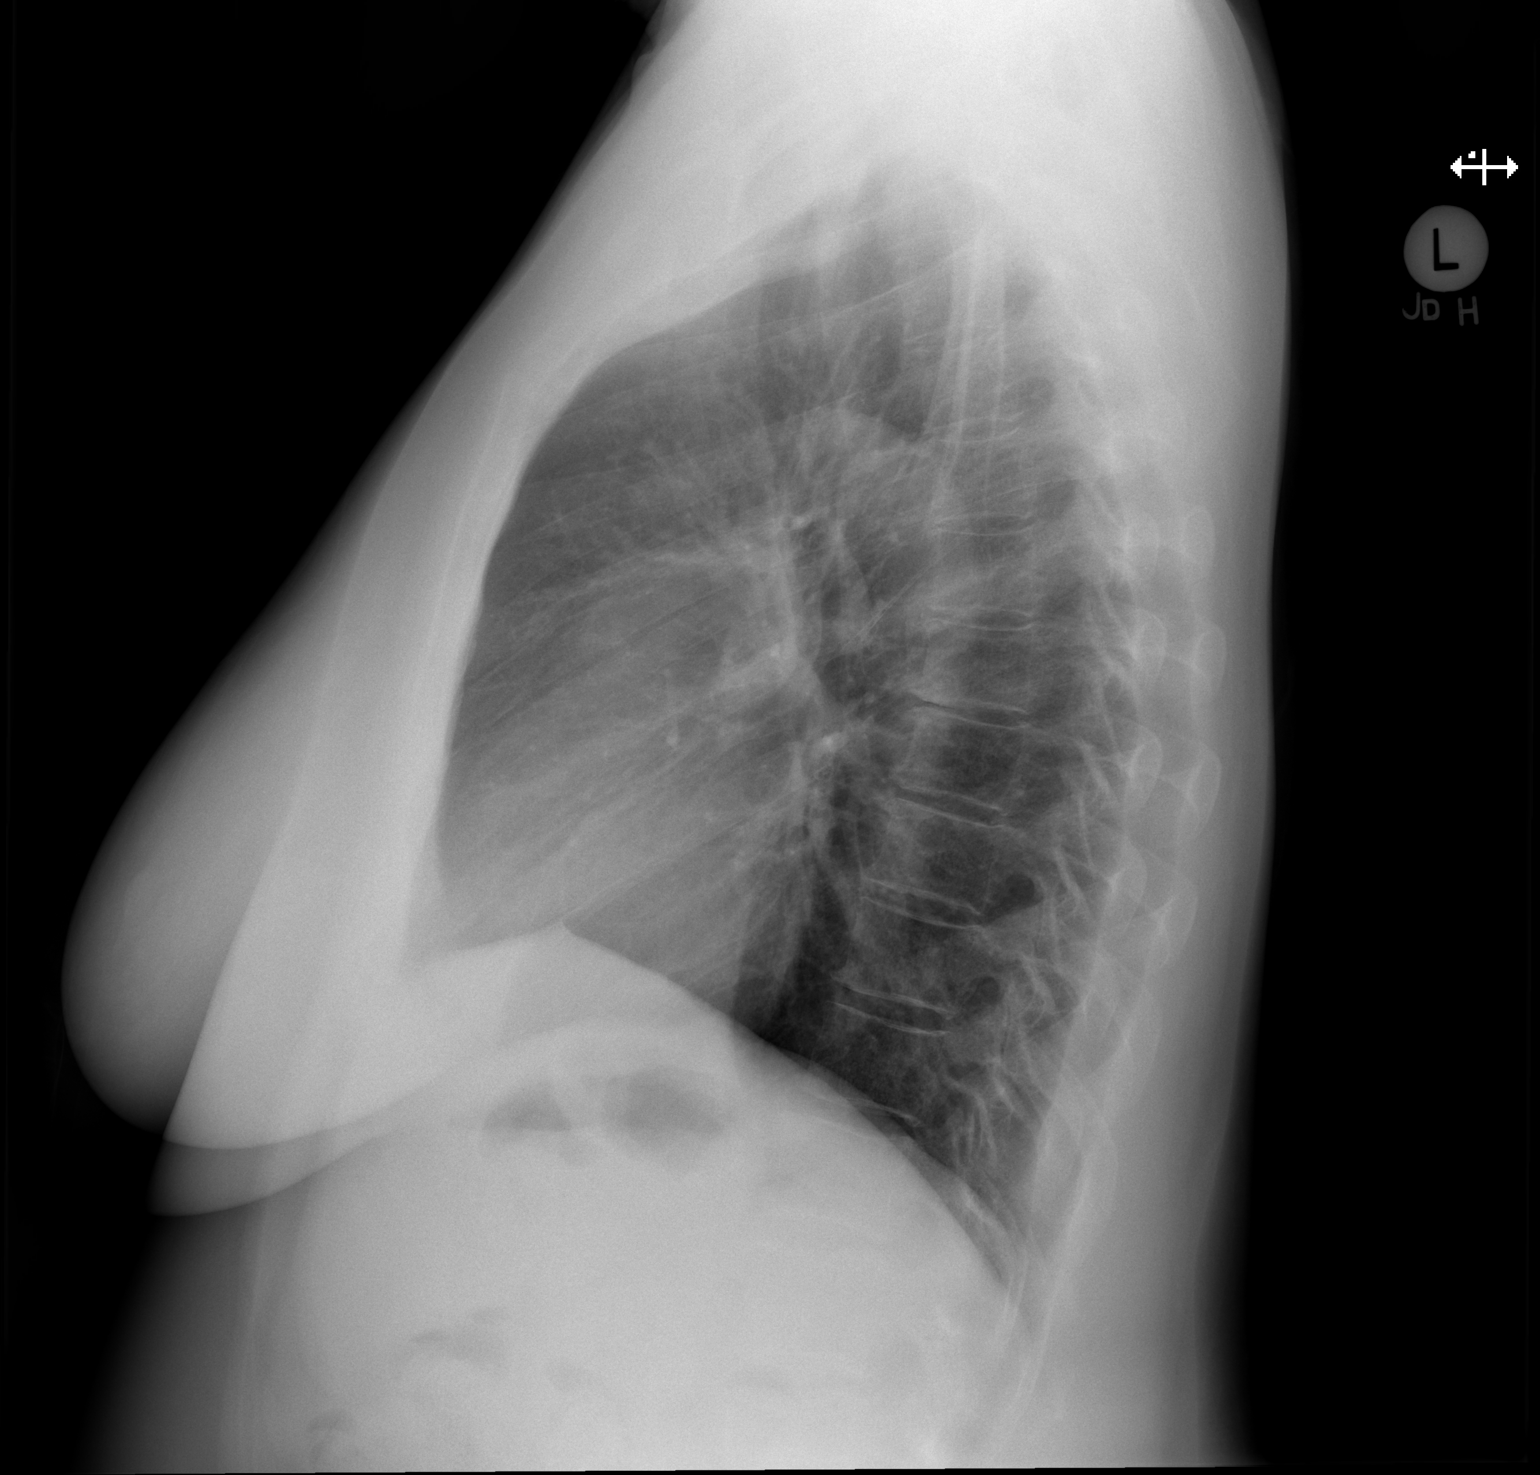

[2 of 2 positions shown; findings below may reference images not displayed]

FINDINGS: The heart size and mediastinal contours are within normal limits.
Both lungs are clear. No pneumothorax or pleural effusion is noted.
The visualized skeletal structures are unremarkable.
IMPRESSION: No active cardiopulmonary disease.

## 2020-01-18 DIAGNOSIS — G629 Polyneuropathy, unspecified: Secondary | ICD-10-CM | POA: Insufficient documentation

## 2020-02-21 ENCOUNTER — Ambulatory Visit (INDEPENDENT_AMBULATORY_CARE_PROVIDER_SITE_OTHER): Payer: 59 | Admitting: Sports Medicine

## 2020-02-21 ENCOUNTER — Encounter: Payer: Self-pay | Admitting: Sports Medicine

## 2020-02-21 ENCOUNTER — Other Ambulatory Visit: Payer: Self-pay

## 2020-02-21 VITALS — BP 93/80 | HR 69 | Temp 97.6°F | Resp 16

## 2020-02-21 DIAGNOSIS — E669 Obesity, unspecified: Secondary | ICD-10-CM | POA: Insufficient documentation

## 2020-02-21 DIAGNOSIS — B351 Tinea unguium: Secondary | ICD-10-CM | POA: Diagnosis not present

## 2020-02-21 DIAGNOSIS — I10 Essential (primary) hypertension: Secondary | ICD-10-CM | POA: Insufficient documentation

## 2020-02-21 DIAGNOSIS — J45909 Unspecified asthma, uncomplicated: Secondary | ICD-10-CM | POA: Insufficient documentation

## 2020-02-21 DIAGNOSIS — R7989 Other specified abnormal findings of blood chemistry: Secondary | ICD-10-CM | POA: Insufficient documentation

## 2020-02-21 DIAGNOSIS — L603 Nail dystrophy: Secondary | ICD-10-CM | POA: Diagnosis not present

## 2020-02-21 DIAGNOSIS — M79675 Pain in left toe(s): Secondary | ICD-10-CM

## 2020-02-21 DIAGNOSIS — E559 Vitamin D deficiency, unspecified: Secondary | ICD-10-CM | POA: Insufficient documentation

## 2020-02-21 NOTE — Patient Instructions (Signed)

## 2020-02-21 NOTE — Progress Notes (Signed)
Subjective: Veronica Elliott is a 59 y.o. female patient presents to office today complaining of a moderately painful spliting/lifting and swollen left 1st toenail. This has been present for 2 weeks and admits and episode of puss coming out of the base of the nail. Patient has treated this by keeping if cleaned with Peroxide. Patient denies fever/chills/nausea/vomitting/any other related constitutional symptoms at this time.  Review of Systems  All other systems reviewed and are negative.    There are no problems to display for this patient.   Current Outpatient Medications on File Prior to Visit  Medication Sig Dispense Refill  . albuterol (PROAIR HFA) 108 (90 Base) MCG/ACT inhaler Inhale 2 puffs into the lungs every 4 (four) hours as needed for wheezing or shortness of breath. 18 g 1  . Azilsartan-Chlorthalidone (EDARBYCLOR) 40-25 MG TABS Take 1 tablet by mouth daily.    . Cholecalciferol (VITAMIN D) 2000 UNITS CAPS Take 1 capsule by mouth daily.    . Multiple Vitamins-Minerals (ONE-A-DAY 50 PLUS PO) Take 1 tablet by mouth daily.    . Omega-3 Fatty Acids (OMEGA 3 PO) Take 1 tablet by mouth daily.    Marland Kitchen QVAR 80 MCG/ACT inhaler inhale 1 PUFF into THE lungs 2 TIMES DAILY 8.7 g 0  . ranitidine (ZANTAC) 150 MG tablet Take 1 tablet (150 mg total) by mouth 2 (two) times daily. 60 tablet 1   No current facility-administered medications on file prior to visit.    Allergies  Allergen Reactions  . Lisinopril Cough    Objective:  There were no vitals filed for this visit.  General: Well developed, nourished, in no acute distress, alert and oriented x3   Dermatology: Skin is warm, dry and supple bilateral. Left hallux nail appears to be severely thickened skin with distal lifting and partial nail attachment (+) Faint erythema. (+) Edema. (-) serosanguous  drainage present. The remaining nails appear unremarkable at this time. There are no open sores, lesions or other signs of infection   present.  Vascular: Dorsalis Pedis artery and Posterior Tibial artery pedal pulses are 2/4 bilateral with immedate capillary fill time. Pedal hair growth present. No lower extremity edema.   Neruologic: Grossly intact via light touch bilateral.  Musculoskeletal: Tenderness to palpation of the left 1st toenail. Muscular strength within normal limits in all groups bilateral.   Assesement and Plan: Problem List Items Addressed This Visit    None    Visit Diagnoses    Nail dystrophy    -  Primary   Onychomycosis       Toe pain, left         -Discussed treatment alternatives and plan of care; Explained permanent/temporary nail avulsion and post procedure course to patient. Patient elects for Temp Nail avulsion Left 1st toenail  - After a verbal and written consent, injected 6 ml of a 50:50 mixture of 2% plain  lidocaine and 0.5% plain marcaine in a normal hallux block fashion. Next, a  betadine prep was performed. Anesthesia was tested and found to be appropriate.  The offending Left hallux nail completely was then incised from the hyponychium to the epinychium. The offending nail border was removed and cleared from the field. The area was curretted for any remaining nail or spicules and cleansed with alcohol and dressed with antibiotic cream and a dry sterile dressing. -Patient was instructed to leave the dressing intact for today and begin soaking  in a weak solution of betadine or Epsom salt and water tomorrow. Patient  was instructed to  soak for 15-20 minutes each day and apply neosporin/corticosporin and a gauze or bandaid dressing each day. -Patient was instructed to monitor the toe for signs of infection and return to office if toe becomes red, hot or swollen. -Advised ice, elevation, and tylenol or motrin if needed for pain.  -Patient is to return in 2 weeks for follow up care/nail check or sooner if problems arise.  Landis Martins, DPM

## 2020-03-13 ENCOUNTER — Other Ambulatory Visit: Payer: Self-pay

## 2020-03-13 ENCOUNTER — Ambulatory Visit (INDEPENDENT_AMBULATORY_CARE_PROVIDER_SITE_OTHER): Payer: 59 | Admitting: Sports Medicine

## 2020-03-13 ENCOUNTER — Encounter: Payer: Self-pay | Admitting: Sports Medicine

## 2020-03-13 VITALS — Temp 98.0°F

## 2020-03-13 DIAGNOSIS — Z9889 Other specified postprocedural states: Secondary | ICD-10-CM

## 2020-03-13 DIAGNOSIS — B351 Tinea unguium: Secondary | ICD-10-CM

## 2020-03-13 DIAGNOSIS — L603 Nail dystrophy: Secondary | ICD-10-CM

## 2020-03-13 DIAGNOSIS — M79675 Pain in left toe(s): Secondary | ICD-10-CM

## 2020-03-13 NOTE — Progress Notes (Signed)
Subjective: ALAZNE QUANT is a 59 y.o. female patient returns to office today for follow up evaluation after having left hallux nail removed temporarily on 02/21/2020.  Patient reports that her toenail feels much better there is no pain to the toe and does not hurt seems like it has healed well. Patient deniesfever/chills/nausea/vomitting/any other related constitutional symptoms at this time.  Patient Active Problem List   Diagnosis Date Noted  . Abnormal C-reactive protein 02/21/2020  . Asthma 02/21/2020  . Benign essential hypertension 02/21/2020  . Obesity 02/21/2020  . Vitamin D deficiency 02/21/2020  . Small fiber neuropathy 01/18/2020    Current Outpatient Medications on File Prior to Visit  Medication Sig Dispense Refill  . albuterol (PROAIR HFA) 108 (90 Base) MCG/ACT inhaler Inhale 2 puffs into the lungs every 4 (four) hours as needed for wheezing or shortness of breath. 18 g 1  . Azilsartan-Chlorthalidone (EDARBYCLOR) 40-25 MG TABS Take 1 tablet by mouth daily.    . Cholecalciferol (VITAMIN D) 2000 UNITS CAPS Take 1 capsule by mouth daily.    . Cholecalciferol (VITAMIN D3 PO) Vitamin D-3 5000  Vitamin D-3 5000 units  Take 1 capsule daily    . Coenzyme Q10 100 MG capsule Co Q-10 100 mg capsule  Take 1 capsule every day by oral route.    . Multiple Vitamin (MULTIVITAMIN ADULT PO) multivitamin  1 tablet orally once a day    . Omega-3 Fatty Acids (OMEGA 3 PO) Take 1 tablet by mouth daily.    Marland Kitchen QVAR 80 MCG/ACT inhaler inhale 1 PUFF into THE lungs 2 TIMES DAILY 8.7 g 0  . rosuvastatin (CRESTOR) 20 MG tablet rosuvastatin 20 mg tablet  TAKE 1 TABLET BY MOUTH EVERY DAY     No current facility-administered medications on file prior to visit.    Allergies  Allergen Reactions  . Lisinopril Cough    Objective:  General: Well developed, nourished, in no acute distress, alert and oriented x3   Dermatology: Skin is warm, dry and supple bilateral.  Left hallux nail bed appears to  be clean and dry with no edema no erythema no acute signs of infection.  The remaining nails appear unremarkable at this time. There are no other lesions or other signs of infection  present.  Neurovascular status: Intact. No lower extremity swelling; No pain with calf compression bilateral.  Musculoskeletal: No pain to palpation left great toenail.  Muscular strength within normal limits bilateral.   Assesement and Plan: Problem List Items Addressed This Visit    None    Visit Diagnoses    Status post nail surgery    -  Primary   Nail dystrophy       Onychomycosis       Toe pain, left          -Examined patient  -Patient may discontinue soaking and discontinue antibiotic cream at this time since procedure site has well-healed at left first toe -Advised patient that she may use a Band-Aid to protect area when in shoe as needed -Advised patient that dried blood will continue to crust and grow out as her body continues to heal after procedure -Patient was instructed to monitor the toe for reoccurrence and signs of infection; Patient advised to return to office or go to ER if toe becomes red, hot or swollen. -Patient is to return as needed or sooner if problems arise.  Asencion Islam, DPM

## 2020-07-06 ENCOUNTER — Other Ambulatory Visit: Payer: Self-pay | Admitting: Orthopaedic Surgery

## 2020-07-06 DIAGNOSIS — M25511 Pain in right shoulder: Secondary | ICD-10-CM

## 2020-07-17 ENCOUNTER — Other Ambulatory Visit: Payer: Managed Care, Other (non HMO)

## 2021-10-02 ENCOUNTER — Other Ambulatory Visit: Payer: Self-pay | Admitting: Internal Medicine

## 2021-10-02 DIAGNOSIS — Z139 Encounter for screening, unspecified: Secondary | ICD-10-CM

## 2021-10-05 ENCOUNTER — Other Ambulatory Visit: Payer: Self-pay

## 2021-10-05 ENCOUNTER — Ambulatory Visit
Admission: RE | Admit: 2021-10-05 | Discharge: 2021-10-05 | Disposition: A | Discharge status: Home / Self Care | Payer: Managed Care, Other (non HMO) | Source: Ambulatory Visit | Attending: Internal Medicine | Admitting: Internal Medicine

## 2021-10-05 DIAGNOSIS — Z139 Encounter for screening, unspecified: Secondary | ICD-10-CM

## 2022-10-29 ENCOUNTER — Other Ambulatory Visit: Payer: Self-pay | Admitting: Internal Medicine

## 2022-10-29 DIAGNOSIS — Z1231 Encounter for screening mammogram for malignant neoplasm of breast: Secondary | ICD-10-CM

## 2022-11-06 ENCOUNTER — Ambulatory Visit
Admission: RE | Admit: 2022-11-06 | Discharge: 2022-11-06 | Disposition: A | Discharge status: Home / Self Care | Payer: Commercial Managed Care - HMO | Source: Ambulatory Visit | Attending: Internal Medicine | Admitting: Internal Medicine

## 2022-11-06 DIAGNOSIS — Z1231 Encounter for screening mammogram for malignant neoplasm of breast: Secondary | ICD-10-CM

## 2024-02-11 ENCOUNTER — Other Ambulatory Visit: Payer: Self-pay | Admitting: Internal Medicine

## 2024-02-11 DIAGNOSIS — R051 Acute cough: Secondary | ICD-10-CM

## 2024-02-18 ENCOUNTER — Ambulatory Visit
Admission: RE | Admit: 2024-02-18 | Discharge: 2024-02-18 | Disposition: A | Discharge status: Home / Self Care | Source: Ambulatory Visit | Attending: Internal Medicine | Admitting: Internal Medicine

## 2024-02-18 DIAGNOSIS — R051 Acute cough: Secondary | ICD-10-CM

## 2024-08-05 ENCOUNTER — Other Ambulatory Visit: Payer: Self-pay | Admitting: Internal Medicine

## 2024-08-05 DIAGNOSIS — Z1231 Encounter for screening mammogram for malignant neoplasm of breast: Secondary | ICD-10-CM

## 2024-08-11 ENCOUNTER — Ambulatory Visit

## 2024-11-17 ENCOUNTER — Encounter

## 2024-11-18 ENCOUNTER — Ambulatory Visit

## 2024-11-21 ENCOUNTER — Inpatient Hospital Stay: Admission: RE | Admit: 2024-11-21 | Discharge: 2024-11-21 | Attending: Internal Medicine | Admitting: Internal Medicine

## 2024-11-21 DIAGNOSIS — Z1231 Encounter for screening mammogram for malignant neoplasm of breast: Secondary | ICD-10-CM
